# Patient Record
Sex: Male | Born: 2005 | Race: Black or African American | Hispanic: No | Marital: Single
Health system: Southern US, Community
[De-identification: ages and names within clinical notes are randomized; demographics above are authoritative.]

---
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
## 2024-04-26 ENCOUNTER — Encounter (HOSPITAL_COMMUNITY): Payer: Self-pay

## 2024-04-26 ENCOUNTER — Ambulatory Visit (HOSPITAL_COMMUNITY)
Admission: EM | Admit: 2024-04-26 | Discharge: 2024-04-26 | Disposition: A | Discharge status: Home / Self Care | Attending: Internal Medicine | Admitting: Internal Medicine

## 2024-04-26 DIAGNOSIS — L03213 Periorbital cellulitis: Secondary | ICD-10-CM | POA: Diagnosis not present

## 2024-04-26 MED ORDER — AMOXICILLIN-POT CLAVULANATE 875-125 MG PO TABS: 1.0000 | ORAL_TABLET | Freq: Two times a day (BID) | ORAL | 0 refills | Status: AC

## 2024-04-26 NOTE — ED Triage Notes (Signed)
 Left eye swelling onset today but redness in both eyes for the last 2 days. No known foreign objects in the eye or injuries. No one with similar symptoms. Denies any vision changes or trouble seeing.   Has not tried any otc meds for symptoms.

## 2024-04-26 NOTE — Discharge Instructions (Addendum)
 Symptoms and physical exam findings are consistent with preseptal cellulitis of the left eye with the swelling and redness under and around the left eye.  We will treat this with antibiotics by mouth.  We will treat with the following: Augmentin 875 mg twice daily for 7 days.  This is an antibiotic.  Take this with food.    May use Tylenol  for pain May use warm compresses to help with symptoms Return to urgent care or PCP if symptoms worsen or fail to resolve.

## 2024-04-26 NOTE — ED Provider Notes (Signed)
 [facility]    CSN: [id number] Arrival date & time: 04/26/24  1539      History   Chief Complaint Chief Complaint  Patient presents with   Eye Problem    HPI George Sellers is a 18 y.o. male.   18 y.o. male who presents to urgent care with complaints of pain, swelling and redness around the left eye. This started about 2 days. He has not had any visual changes, fevers, chills, headaches. The area is very tender to palp. He has not taken any medications for this.    Eye Problem Associated symptoms: redness   Associated symptoms: no vomiting     Past Medical History:  Diagnosis Date   Asthma     There are no active problems to display for this patient.   History reviewed. No pertinent surgical history.     Home Medications    Prior to Admission medications   Medication Sig Start Date End Date Taking? Authorizing Provider  amoxicillin-clavulanate (AUGMENTIN) 875-125 MG tablet Take 1 tablet by mouth every 12 (twelve) hours. 04/26/24  Yes Teresa Almarie LABOR, PA-C    Family History History reviewed. No pertinent family history.  Social History Social History   Tobacco Use   Smoking status: Passive Smoke Exposure - Never Smoker   Smokeless tobacco: Never  Vaping Use   Vaping status: Never Used  Substance Use Topics   Alcohol use: No   Drug use: No     Allergies   Patient has no known allergies.   Review of Systems Review of Systems  Constitutional:  Negative for chills and fever.  HENT:  Negative for ear pain and sore throat.   Eyes:  Positive for pain and redness. Negative for visual disturbance.  Respiratory:  Negative for cough and shortness of breath.   Cardiovascular:  Negative for chest pain and palpitations.  Gastrointestinal:  Negative for abdominal pain and vomiting.  Genitourinary:  Negative for dysuria and hematuria.  Musculoskeletal:  Negative for arthralgias and back pain.  Skin:  Negative for color change and rash.   Neurological:  Negative for seizures and syncope.  All other systems reviewed and are negative.    Physical Exam Triage Vital Signs ED Triage Vitals  Encounter Vitals Group     BP 04/26/24 1643 111/69     Girls Systolic BP Percentile --      Girls Diastolic BP Percentile --      Boys Systolic BP Percentile --      Boys Diastolic BP Percentile --      Pulse Rate 04/26/24 1643 63     Resp 04/26/24 1643 16     Temp 04/26/24 1643 98.2 F (36.8 C)     Temp Source 04/26/24 1643 Oral     SpO2 04/26/24 1643 94 %     Weight 04/26/24 1643 185 lb (83.9 kg)     Height 04/26/24 1643 5' 10 (1.778 m)     Head Circumference --      Peak Flow --      Pain Score 04/26/24 1642 5     Pain Loc --      Pain Education --      Exclude from Growth Chart --    No data found.  Updated Vital Signs BP 111/69 (BP Location: Left Arm)   Pulse 63   Temp 98.2 F (36.8 C) (Oral)   Resp 16   Ht 5' 10 (1.778 m)   Wt 185 lb (83.9  kg)   SpO2 94%   BMI 26.54 kg/m   Visual Acuity Right Eye Distance:   Left Eye Distance:   Bilateral Distance:    Right Eye Near:   Left Eye Near:    Bilateral Near:     Physical Exam Vitals and nursing note reviewed.  Constitutional:      General: He is not in acute distress.    Appearance: He is well-developed.  HENT:     Head: Normocephalic and atraumatic.  Eyes:     Extraocular Movements: Extraocular movements intact.     Right eye: Normal extraocular motion.     Left eye: Normal extraocular motion.     Conjunctiva/sclera: Conjunctivae normal.     Right eye: Right conjunctiva is not injected.     Left eye: Left conjunctiva is not injected.     Pupils: Pupils are equal, round, and reactive to light.     Comments: Soft tissue swelling and edema around the left eye extending down into the left cheek  Cardiovascular:     Rate and Rhythm: Normal rate and regular rhythm.     Heart sounds: No murmur heard. Pulmonary:     Effort: Pulmonary effort is normal.  No respiratory distress.     Breath sounds: Normal breath sounds.  Abdominal:     Palpations: Abdomen is soft.     Tenderness: There is no abdominal tenderness.  Musculoskeletal:        General: No swelling.     Cervical back: Neck supple.  Skin:    General: Skin is warm and dry.     Capillary Refill: Capillary refill takes less than 2 seconds.  Neurological:     Mental Status: He is alert.  Psychiatric:        Mood and Affect: Mood normal.      UC Treatments / Results  Labs (all labs ordered are listed, but only abnormal results are displayed) Labs Reviewed - No data to display  EKG   Radiology No results found.  Procedures Procedures (including critical care time)  Medications Ordered in UC Medications - No data to display  Initial Impression / Assessment and Plan / UC Course  I have reviewed the triage vital signs and the nursing notes.  Pertinent labs & imaging results that were available during my care of the patient were reviewed by me and considered in my medical decision making (see chart for details).     Preseptal cellulitis of left eye   Symptoms and physical exam findings are consistent with preseptal cellulitis of the left eye with the swelling and redness under and around the left eye.  We will treat this with antibiotics by mouth.  We will treat with the following: Augmentin 875 mg twice daily for 7 days.  This is an antibiotic.  Take this with food.    May use Tylenol  for pain May use warm compresses to help with symptoms Return to urgent care or PCP if symptoms worsen or fail to resolve.    Final Clinical Impressions(s) / UC Diagnoses   Final diagnoses:  Preseptal cellulitis of left eye     Discharge Instructions      Symptoms and physical exam findings are consistent with preseptal cellulitis of the left eye with the swelling and redness under and around the left eye.  We will treat this with antibiotics by mouth.  We will treat with the  following: Augmentin 875 mg twice daily for 7 days.  This is an  antibiotic.  Take this with food.    May use Tylenol  for pain May use warm compresses to help with symptoms Return to urgent care or PCP if symptoms worsen or fail to resolve.      ED Prescriptions     Medication Sig Dispense Auth. Provider   amoxicillin-clavulanate (AUGMENTIN) 875-125 MG tablet Take 1 tablet by mouth every 12 (twelve) hours. 14 tablet Teresa Almarie LABOR, PA-C      PDMP not reviewed this encounter.   Teresa Almarie LABOR, [place] 04/26/24 1733
